# Patient Record
Sex: Male | Born: 1980 | Race: White | Hispanic: No | Marital: Married | Smoking: Never smoker
Health system: Southern US, Community
[De-identification: ages and names within clinical notes are randomized; demographics above are authoritative.]

## PROBLEM LIST (undated history)

## (undated) DIAGNOSIS — N289 Disorder of kidney and ureter, unspecified: Secondary | ICD-10-CM

## (undated) HISTORY — PX: APPENDECTOMY: SHX54

---

## 2009-11-30 ENCOUNTER — Emergency Department (HOSPITAL_COMMUNITY): Admission: EM | Admit: 2009-11-30 | Discharge: 2009-12-01 | Payer: Self-pay | Admitting: Psychology

## 2011-01-16 LAB — BASIC METABOLIC PANEL
CO2: 23 mEq/L (ref 19–32)
Calcium: 9 mg/dL (ref 8.4–10.5)
Creatinine, Ser: 1.12 mg/dL (ref 0.4–1.5)
GFR calc Af Amer: >60 mL/min (ref >60)
Glucose, Bld: 88 mg/dL (ref 70–99)

## 2011-01-16 LAB — DIFFERENTIAL
Basophils Absolute: 0 10*3/uL (ref 0.0–0.1)
Basophils Relative: 0 % (ref 0–1)
Neutro Abs: 7.6 10*3/uL (ref 1.7–7.7)
Neutrophils Relative %: 83 % — ABNORMAL HIGH (ref 43–77)

## 2011-01-16 LAB — CBC
MCHC: 33.8 g/dL (ref 30.0–36.0)
Platelets: 207 10*3/uL (ref 150–400)
RBC: 5.44 MIL/uL (ref 4.22–5.81)
RDW: 12.5 % (ref 11.5–15.5)

## 2017-02-12 ENCOUNTER — Emergency Department (HOSPITAL_BASED_OUTPATIENT_CLINIC_OR_DEPARTMENT_OTHER)
Admission: EM | Admit: 2017-02-12 | Discharge: 2017-02-13 | Disposition: A | Discharge status: Home / Self Care | Payer: Self-pay | Attending: Emergency Medicine | Admitting: Emergency Medicine

## 2017-02-12 ENCOUNTER — Emergency Department (HOSPITAL_BASED_OUTPATIENT_CLINIC_OR_DEPARTMENT_OTHER): Payer: Self-pay

## 2017-02-12 ENCOUNTER — Encounter (HOSPITAL_BASED_OUTPATIENT_CLINIC_OR_DEPARTMENT_OTHER): Payer: Self-pay | Admitting: *Deleted

## 2017-02-12 DIAGNOSIS — N200 Calculus of kidney: Secondary | ICD-10-CM | POA: Insufficient documentation

## 2017-02-12 LAB — URINALYSIS, MICROSCOPIC (REFLEX)

## 2017-02-12 LAB — BASIC METABOLIC PANEL
Anion gap: 10 (ref 5–15)
BUN: 25 mg/dL — AB (ref 6–20)
CALCIUM: 9.5 mg/dL (ref 8.9–10.3)
CO2: 27 mmol/L (ref 22–32)
CREATININE: 1.36 mg/dL — AB (ref 0.61–1.24)
Chloride: 101 mmol/L (ref 101–111)
GFR calc Af Amer: >60 mL/min (ref >60)
GFR calc non Af Amer: >60 mL/min (ref >60)
GLUCOSE: 139 mg/dL — AB (ref 65–99)
Potassium: 3.1 mmol/L — ABNORMAL LOW (ref 3.5–5.1)
Sodium: 138 mmol/L (ref 135–145)

## 2017-02-12 LAB — URINALYSIS, ROUTINE W REFLEX MICROSCOPIC
BILIRUBIN URINE: NEGATIVE
GLUCOSE, UA: NEGATIVE mg/dL
Ketones, ur: >80 mg/dL — AB
NITRITE: NEGATIVE
Protein, ur: NEGATIVE mg/dL
SPECIFIC GRAVITY, URINE: 1.021 (ref 1.005–1.030)
pH: 7 (ref 5.0–8.0)

## 2017-02-12 LAB — CBC
HEMATOCRIT: 47.4 % (ref 39.0–52.0)
Hemoglobin: 16 g/dL (ref 13.0–17.0)
MCH: 28.2 pg (ref 26.0–34.0)
MCHC: 33.8 g/dL (ref 30.0–36.0)
MCV: 83.6 fL (ref 78.0–100.0)
Platelets: 325 10*3/uL (ref 150–400)
RBC: 5.67 MIL/uL (ref 4.22–5.81)
RDW: 13.4 % (ref 11.5–15.5)
WBC: 11.1 10*3/uL — ABNORMAL HIGH (ref 4.0–10.5)

## 2017-02-12 MED ORDER — HYDROMORPHONE HCL 1 MG/ML IJ SOLN
1.0000 mg | Freq: Once | INTRAMUSCULAR | Status: AC
  Administered 2017-02-12: 1 mg via INTRAVENOUS
  Filled 2017-02-12: qty 1

## 2017-02-12 MED ORDER — FENTANYL CITRATE (PF) 100 MCG/2ML IJ SOLN
50.0000 ug | INTRAMUSCULAR | Status: DC | PRN
  Administered 2017-02-12: 50 ug via INTRAVENOUS
  Filled 2017-02-12: qty 2

## 2017-02-12 MED ORDER — ONDANSETRON HCL 4 MG/2ML IJ SOLN
4.0000 mg | Freq: Once | INTRAMUSCULAR | Status: AC
  Administered 2017-02-12: 4 mg via INTRAVENOUS
  Filled 2017-02-12: qty 2

## 2017-02-12 NOTE — ED Notes (Signed)
EMT went to ask Patient for a urine sample. Patient stated he was unable to pee.

## 2017-02-12 NOTE — ED Provider Notes (Signed)
[facility] Provider Note   CSN: [id number] Arrival date & time: 02/12/17  2108  By signing my name below, I, Nelwyn Salisbury, attest that this documentation has been prepared under the direction and in the presence of non-physician practitioner, Graciella Freer, PA-C. Electronically Signed: Nelwyn Salisbury, Scribe. 02/12/2017. 10:35 PM.  History   Chief Complaint Chief Complaint  Patient presents with  . Abdominal Pain   The history is provided by the patient. No language interpreter was used.    HPI Comments:  Paul Duran is an otherwise healthy 36 y.o. male who presents to the Emergency Department complaining of constant, moderate left-flank pain onset 7 hours. He describes his pain as a 10/10 sharp pain radiating to his groin which is exacerbated by movement and certain positions. Pt reports associated vomiting, nausea and hematuria. He has tried acetaminophen and was given Fentanyl here in the ED with minimal relief. Denies any diarrhea, bloody stool, dysuria, difficulty urinating, fever, testicular swelling, testicular pain or any other symptoms. Pt has a previous abdominal shx of cholecystectomy.   History reviewed. No pertinent past medical history.  There are no active problems to display for this patient.   Past Surgical History:  Procedure Laterality Date  . APPENDECTOMY         Home Medications    Prior to Admission medications   Medication Sig Start Date End Date Taking? Authorizing Provider  oxyCODONE-acetaminophen (PERCOCET/ROXICET) 5-325 MG tablet Take 2 tablets by mouth every 4 (four) hours as needed for severe pain. 02/13/17   Maxwell Caul, PA-C  tamsulosin (FLOMAX) 0.4 MG CAPS capsule Take 1 capsule (0.4 mg total) by mouth daily. 02/13/17   Maxwell Caul, PA-C    Family History No family history on file.  Social History Social History  Substance Use Topics  . Smoking status: Never Smoker  . Smokeless tobacco: Never Used  .  Alcohol use No     Allergies   Patient has no known allergies.   Review of Systems Review of Systems  Constitutional: Negative for chills and fever.  HENT: Negative for congestion, rhinorrhea and sore throat.   Eyes: Negative for visual disturbance.  Respiratory: Negative for cough and shortness of breath.   Cardiovascular: Negative for chest pain.  Gastrointestinal: Positive for abdominal pain, nausea and vomiting. Negative for blood in stool and diarrhea.  Genitourinary: Positive for hematuria. Negative for difficulty urinating, dysuria, penile swelling, scrotal swelling and testicular pain.  Musculoskeletal: Negative for back pain and neck pain.  Skin: Negative for rash.  Neurological: Negative for dizziness, weakness, numbness and headaches.  Psychiatric/Behavioral: Negative for confusion.  All other systems reviewed and are negative.    Physical Exam Updated Vital Signs BP (!) 146/90   Pulse 85   Temp 97.5 F (36.4 C) (Oral)   Resp 16   Ht  (1.803 m)   Wt 83 kg   SpO2 97%   BMI 25.52 kg/m   Physical Exam  Constitutional: He is oriented to person, place, and time. He appears well-developed and well-nourished.  Appears uncomfortable, constantly changing positions in bed  HENT:  Head: Normocephalic and atraumatic.  Mouth/Throat: Oropharynx is clear and moist and mucous membranes are normal.  Eyes: Conjunctivae, EOM and lids are normal. Pupils are equal, round, and reactive to light.  Neck: Full passive range of motion without pain.  Cardiovascular: Normal rate, regular rhythm, normal heart sounds and normal pulses.  Exam reveals no gallop and no friction rub.   No  murmur heard. Pulmonary/Chest: Effort normal and breath sounds normal.  Abdominal: Soft. Normal appearance. There is tenderness in the left lower quadrant. There is CVA tenderness (Left).  Genitourinary: Testes normal and penis normal. Right testis shows no swelling and no tenderness. Left testis  shows no swelling and no tenderness. No penile tenderness.  Genitourinary Comments: The exam was performed with a chaperone present.  Musculoskeletal: Normal range of motion.  Neurological: He is alert and oriented to person, place, and time.  Skin: Skin is warm and dry. Capillary refill takes less than 2 seconds.  Psychiatric: He has a normal mood and affect. His speech is normal.  Nursing note and vitals reviewed.    ED Treatments / Results  DIAGNOSTIC STUDIES:  Oxygen Saturation is 100% on RA, normal by my interpretation.    COORDINATION OF CARE:  10:35 PM Discussed treatment plan with pt at bedside which includes dilaudid and imaging and pt agreed to plan.  Labs (all labs ordered are listed, but only abnormal results are displayed) Labs Reviewed  URINALYSIS, ROUTINE W REFLEX MICROSCOPIC - Abnormal; Notable for the following:       Result Value   APPearance CLOUDY (*)    Hgb urine dipstick LARGE (*)    Ketones, ur >80 (*)    Leukocytes, UA TRACE (*)    All other components within normal limits  CBC - Abnormal; Notable for the following:    WBC 11.1 (*)    All other components within normal limits  BASIC METABOLIC PANEL - Abnormal; Notable for the following:    Potassium 3.1 (*)    Glucose, Bld 139 (*)    BUN 25 (*)    Creatinine, Ser 1.36 (*)    All other components within normal limits  URINALYSIS, MICROSCOPIC (REFLEX) - Abnormal; Notable for the following:    Bacteria, UA FEW (*)    Squamous Epithelial / LPF 0-5 (*)    All other components within normal limits    EKG  EKG Interpretation None       Radiology Ct Renal Stone Study  Result Date: 02/12/2017 CLINICAL DATA:  Left lower quadrant pain for 8 hours. Unable to urinate. Nausea and vomiting. Hematuria. EXAM: CT ABDOMEN AND PELVIS WITHOUT CONTRAST TECHNIQUE: Multidetector CT imaging of the abdomen and pelvis was performed following the standard protocol without IV contrast. COMPARISON:  None. FINDINGS:  Lower chest: Atelectasis in the lung bases. Calcified granuloma in the right lung base. Hepatobiliary: No focal liver abnormality is seen. No gallstones, gallbladder wall thickening, or biliary dilatation. Pancreas: Unremarkable. No pancreatic ductal dilatation or surrounding inflammatory changes. Spleen: Normal in size without focal abnormality. Adrenals/Urinary Tract: No adrenal gland nodules. 4 mm stone in the proximal left ureter at the level of L4. There is proximal hydronephrosis and hydroureter with prominent stranding and edema around the left kidney and ureter. The distal ureter is decompressed. Additional intrarenal stones are demonstrated in both kidneys. No evidence of ureteral stone or obstruction on the right. Bladder wall is not thickened and no bladder stones are identified. Stomach/Bowel: Stomach, small bowel, and colon are not abnormally distended. Scattered stool in the colon. Colonic diverticula mostly in the sigmoid region. No evidence of diverticulitis. Appendix is surgically absent. Vascular/Lymphatic: No significant vascular findings are present. No enlarged abdominal or pelvic lymph nodes. Reproductive: Prostate gland is enlarged, measuring 4.6 cm diameter. Prostate calcifications are present. Other: No abdominal wall hernia or abnormality. No abdominopelvic ascites. Musculoskeletal: Degenerative changes in the lower lumbar spine. No destructive  bone lesions. IMPRESSION: 4 mm stone in the proximal left ureter with moderate proximal obstruction. Additional nonobstructing intrarenal stones in both kidneys. Prostate gland is enlarged. Electronically Signed   By: Burman Nieves M.D.   On: 02/12/2017 23:27    Procedures Procedures (including critical care time)  Medications Ordered in ED Medications  ondansetron [facility]) injection 4 mg (4 mg Intravenous Given 02/12/17 2151)  HYDROmorphone (DILAUDID) injection 1 mg (1 mg Intravenous Given 02/12/17 2243)  HYDROmorphone (DILAUDID)  injection 1 mg (1 mg Intravenous Given 02/13/17 0012)  ketorolac (TORADOL) 30 MG/ML injection 30 mg (30 mg Intravenous Given 02/13/17 0100)     Initial Impression / Assessment and Plan / ED Course  I have reviewed the triage vital signs and the nursing notes.  Pertinent labs & imaging results that were available during my care of the patient were reviewed by me and considered in my medical decision making (see chart for details).     36 year old male presents emergency Department with left flank pain that radiates to the left lower quadrant and left groin that began today at 3:30 pm. Also reports hematuria but no dysuria. Took Tylenol with no relief. Physical exam with left-sided CVA tenderness left lower quadrant tenderness. GU exam normal. Concern for kidney stone, given history/exam versus acute infectious etiology. Antiemetics and analgesics provided in the department. Labs ordered in triage. Patient still needs to provide a urine sample. IVF given for fluid resuscitation. Will obtain CT renal stone to evaluate for kidney stone.   Labs and imaging reviewed. CT renal stone positive for 4mm kidney stone. Results discussed with patient. He is still in significant pain after  of Dilaudid. Will give additional Toradol for increased analgesic effect. Will attempt better pain control before discharging home.   Re-evaluation: Patient reports feeling improved after Toradol and feels comfortable going home. Labs and imaging reviewed with patient. Instructed patient to follow-up with referred urologist in 2 days. Return precautions discussed. Patient expresses understanding and agreement to plan.    Final Clinical Impressions(s) / ED Diagnoses   Final diagnoses:  Kidney stone    New Prescriptions Discharge Medication List as of 02/13/2017  1:35 AM    START taking these medications   Details  oxyCODONE-acetaminophen (PERCOCET/ROXICET) 5-325 MG tablet Take 2 tablets by mouth every 4 (four) hours  as needed for severe pain., Starting Fri 02/13/2017, Print    tamsulosin (FLOMAX) 0.4 MG CAPS capsule Take 1 capsule (0.4 mg total) by mouth daily., Starting Fri 02/13/2017, Print      I personally performed the services described in this documentation, which was scribed in my presence. The recorded information has been reviewed and is accurate.     Maxwell Caul, PA-C 02/13/17 2157    Vanetta Mulders, MD 02/14/17 1556

## 2017-02-12 NOTE — ED Triage Notes (Signed)
Left lower quadrant pain since 3pm. No radiation. Restless.

## 2017-02-12 NOTE — ED Notes (Signed)
Patient transported to CT 

## 2017-02-13 MED ORDER — KETOROLAC TROMETHAMINE 30 MG/ML IJ SOLN
INTRAMUSCULAR | Status: AC
  Filled 2017-02-13: qty 1

## 2017-02-13 MED ORDER — HYDROMORPHONE HCL 1 MG/ML IJ SOLN
1.0000 mg | Freq: Once | INTRAMUSCULAR | Status: AC
  Administered 2017-02-13: 1 mg via INTRAVENOUS
  Filled 2017-02-13: qty 1

## 2017-02-13 MED ORDER — OXYCODONE-ACETAMINOPHEN 5-325 MG PO TABS
2.0000 | ORAL_TABLET | ORAL | 0 refills | Status: DC | PRN
Start: 2017-02-13 — End: 2017-10-24

## 2017-02-13 MED ORDER — KETOROLAC TROMETHAMINE 30 MG/ML IJ SOLN
30.0000 mg | Freq: Once | INTRAMUSCULAR | Status: AC
  Administered 2017-02-13: 30 mg via INTRAVENOUS

## 2017-02-13 MED ORDER — TAMSULOSIN HCL 0.4 MG PO CAPS: 0.4000 mg | ORAL_CAPSULE | Freq: Every day | ORAL | 0 refills | Status: AC

## 2017-02-13 NOTE — Discharge Instructions (Signed)
Your CT scan showed a kidney stone. Make sure you're drinking lots of fluids and staying hydrated.  Take pain medication as directed for pain relief.  Take Flomax as directed.  Called the urologist that is provided above in 24-48 hours to arrange for further appointment.  Return to the emergency department for any worsening pain, fever, persistent vomiting, or any other worsening or concerning symptoms.  If you do not have a primary care doctor you see regularly, please you the list below. Please call them to arrange for follow-up.    No Primary Care Doctor Call [facility]  [phone] Other agencies that provide inexpensive medical care    [facility]  [phone]    [facility]  [phone]    [facility]  [phone]    [facility]  [phone]    [facility]  [phone]    [facility]  [phone]

## 2017-10-24 ENCOUNTER — Other Ambulatory Visit: Payer: Self-pay

## 2017-10-24 ENCOUNTER — Emergency Department (HOSPITAL_BASED_OUTPATIENT_CLINIC_OR_DEPARTMENT_OTHER)
Admission: EM | Admit: 2017-10-24 | Discharge: 2017-10-24 | Disposition: A | Discharge status: Home / Self Care | Payer: Self-pay | Attending: Emergency Medicine | Admitting: Emergency Medicine

## 2017-10-24 ENCOUNTER — Emergency Department (HOSPITAL_BASED_OUTPATIENT_CLINIC_OR_DEPARTMENT_OTHER): Payer: Self-pay

## 2017-10-24 ENCOUNTER — Encounter (HOSPITAL_BASED_OUTPATIENT_CLINIC_OR_DEPARTMENT_OTHER): Payer: Self-pay | Admitting: *Deleted

## 2017-10-24 DIAGNOSIS — N453 Epididymo-orchitis: Secondary | ICD-10-CM | POA: Insufficient documentation

## 2017-10-24 HISTORY — DX: Disorder of kidney and ureter, unspecified: N28.9

## 2017-10-24 MED ORDER — OXYCODONE-ACETAMINOPHEN 5-325 MG PO TABS
1.0000 | ORAL_TABLET | Freq: Once | ORAL | Status: AC
  Administered 2017-10-24: 1 via ORAL
  Filled 2017-10-24: qty 1

## 2017-10-24 MED ORDER — OXYCODONE-ACETAMINOPHEN 5-325 MG PO TABS: 1.0000 | ORAL_TABLET | ORAL | 0 refills | Status: AC | PRN

## 2017-10-24 MED ORDER — CEFTRIAXONE SODIUM 250 MG IJ SOLR
250.0000 mg | Freq: Once | INTRAMUSCULAR | Status: AC
  Administered 2017-10-24: 250 mg via INTRAMUSCULAR
  Filled 2017-10-24: qty 250

## 2017-10-24 MED ORDER — DOXYCYCLINE HYCLATE 100 MG PO TABS
100.0000 mg | ORAL_TABLET | Freq: Once | ORAL | Status: AC
  Administered 2017-10-24: 100 mg via ORAL
  Filled 2017-10-24: qty 1

## 2017-10-24 MED ORDER — DOXYCYCLINE HYCLATE 100 MG PO CAPS: 100.0000 mg | ORAL_CAPSULE | Freq: Two times a day (BID) | ORAL | 0 refills | Status: AC

## 2017-10-24 NOTE — ED Triage Notes (Signed)
Patient states he developed left testicular pain around 10/15/2017.  States the veins in the right testicle began to swell in the beginning, and after several days the testicle began to swell and have pain.  States the pain and swelling have been constant and over the last three days the pain has gotten worse.

## 2017-10-24 NOTE — ED Provider Notes (Signed)
Emergency Department Provider Note   I have reviewed the triage vital signs and the nursing notes.   HISTORY  Chief Complaint Testicle Pain   HPI Paul Duran is a 36 y.o. male for evaluation of severe left testicle pain over the last 3 days.  Patient states he began having some discomfort and swelling in the area 9 days ago but the severe pain started only 3 days.  Radiates slightly to the left inguinal area.  The patient has had kidney stones in the past and states that this feels very different from that.  He denies any dysuria.  He did notice some blood in the semen last week but that has resolved.  No fevers or chills.  Denies trauma to the area.  Over-the-counter medications and watchful waiting with no relief in symptoms. Pain worse with movement and relieved by laying in certain positions.   Past Medical History:  Diagnosis Date  . Renal disorder    Kidney stone    There are no active problems to display for this patient.   Past Surgical History:  Procedure Laterality Date  . APPENDECTOMY      Current Outpatient Rx  . Order #: [id number] Class: Print  . Order #: [id number] Class: Print  . Order #: [id number] Class: Print    Allergies Patient has no known allergies.  No family history on file.  Social History Social History   Tobacco Use  . Smoking status: Never Smoker  . Smokeless tobacco: Never Used  Substance Use Topics  . Alcohol use: Yes    Comment: occassionally  . Drug use: Yes    Types: Marijuana    Review of Systems  Constitutional: No fever/chills Eyes: No visual changes. ENT: No sore throat. Cardiovascular: Denies chest pain. Respiratory: Denies shortness of breath. Gastrointestinal: No abdominal pain.  No nausea, no vomiting.  No diarrhea.  No constipation. Genitourinary: Negative for dysuria. Positive left testicle pain and swelling.  Musculoskeletal: Negative for back pain. Skin: Negative for rash. Neurological: Negative for  headaches, focal weakness or numbness.  10-point ROS otherwise negative.  ____________________________________________   PHYSICAL EXAM:  VITAL SIGNS: ED Triage Vitals  Enc Vitals Group     BP 10/24/17 1000 134/80     Pulse Rate 10/24/17 1000 (!) 110     Resp 10/24/17 1000 (!) 22     Temp 10/24/17 1000 98.4 F (36.9 C)     Temp Source 10/24/17 1000 Oral     SpO2 10/24/17 1000 100 %     Weight 10/24/17 1000 200 lb (90.7 kg)     Height 10/24/17 1000 5\' 10"  (1.778 m)     Pain Score 10/24/17 1007 10    Constitutional: Alert and oriented. Appears uncomfortable but provides a full history.  Eyes: Conjunctivae are normal. Head: Atraumatic. Nose: No congestion/rhinnorhea. Mouth/Throat: Mucous membranes are moist.  Oropharynx non-erythematous. Neck: No stridor.   Cardiovascular: Normal rate, regular rhythm. Good peripheral circulation. Grossly normal heart sounds.   Respiratory: Normal respiratory effort.  No retractions. Lungs CTAB. Gastrointestinal: Soft and nontender. No distention.  Genitourinary: Mild swelling of the left testicle with some posterior tenderness. No appreciable inguinal hernia. No scrotal cellulitis.  Musculoskeletal: No lower extremity tenderness nor edema. No gross deformities of extremities. Neurologic:  Normal speech and language. No gross focal neurologic deficits are appreciated.  Skin:  Skin is warm, dry and intact. No rash noted. ____________________________________________  RADIOLOGY  [place] Scrotum  Result Date: 10/24/2017 CLINICAL DATA:  Severe left testicular pain  for 3 days. EXAM: SCROTAL ULTRASOUND DOPPLER ULTRASOUND OF THE TESTICLES TECHNIQUE: Complete ultrasound examination of the testicles, epididymis, and other scrotal structures was performed. Color and spectral Doppler ultrasound were also utilized to evaluate blood flow to the testicles. COMPARISON:  None. FINDINGS: Right testicle Measurements: 4.2 x 2.2 x 2.3 cm. No mass or microlithiasis  visualized. Left testicle Measurements: 4.6 x 2.7 x 3.3 cm. The left testicle is hypervascular compared to the right testicle. No mass or microlithiasis visualized. Right epididymis:  Normal in size and appearance. Left epididymis:  Enlarged and hypervascular. Hydrocele:  Left hydrocele. Varicocele:  None visualized. Pulsed Doppler interrogation of both testes demonstrates normal low resistance arterial and venous waveforms bilaterally. IMPRESSION: Left orchitis/ epididymitis.  No evidence of testicular torsion. Electronically Signed   By: Sherian Rein M.D.   On: 10/24/2017 11:12   [place] Art/ven Flow Abd Pelv Doppler  Result Date: 10/24/2017 CLINICAL DATA:  Severe left testicular pain for 3 days. EXAM: SCROTAL ULTRASOUND DOPPLER ULTRASOUND OF THE TESTICLES TECHNIQUE: Complete ultrasound examination of the testicles, epididymis, and other scrotal structures was performed. Color and spectral Doppler ultrasound were also utilized to evaluate blood flow to the testicles. COMPARISON:  None. FINDINGS: Right testicle Measurements: 4.2 x 2.2 x 2.3 cm. No mass or microlithiasis visualized. Left testicle Measurements: 4.6 x 2.7 x 3.3 cm. The left testicle is hypervascular compared to the right testicle. No mass or microlithiasis visualized. Right epididymis:  Normal in size and appearance. Left epididymis:  Enlarged and hypervascular. Hydrocele:  Left hydrocele. Varicocele:  None visualized. Pulsed Doppler interrogation of both testes demonstrates normal low resistance arterial and venous waveforms bilaterally. IMPRESSION: Left orchitis/ epididymitis.  No evidence of testicular torsion. Electronically Signed   By: Sherian Rein M.D.   On: 10/24/2017 11:12    ____________________________________________   PROCEDURES  Procedure(s) performed:   Procedures  None ____________________________________________   INITIAL IMPRESSION / ASSESSMENT AND PLAN / ED COURSE  Pertinent labs & imaging results that were  available during my care of the patient were reviewed by me and considered in my medical decision making (see chart for details).  Patient presents to the emergency department for evaluation of left testicle pain worsening significantly over the past 3 days but present for the last 9 days.  He has tenderness over the epididymis increasing my suspicion for epididymitis versus hydrocele.  No appreciable inguinal hernia.  Lower suspicion for testicular torsion but plan for ultrasound to evaluate for this diagnosis.   [place] with orchitis. No evidence of torsion. Plan for treatment with Ceftriaxone x 1 and Doxycycline x 10 days. Provided Urology contact for follow up. Treating pain with Percocet.   At this time, I do not feel there is any life-threatening condition present. I have reviewed and discussed all results (EKG, imaging, lab, urine as appropriate), exam findings with patient. I have reviewed nursing notes and appropriate previous records.  I feel the patient is safe to be discharged home without further emergent workup. Discussed usual and customary return precautions. Patient and family (if present) verbalize understanding and are comfortable with this plan.  Patient will follow-up with their primary care provider. If they do not have a primary care provider, information for follow-up has been provided to them. All questions have been answered.  ____________________________________________  FINAL CLINICAL IMPRESSION(S) / ED DIAGNOSES  Final diagnoses:  Orchitis and epididymitis     MEDICATIONS GIVEN DURING THIS VISIT:  Medications  oxyCODONE-acetaminophen (PERCOCET/ROXICET) 5-325 MG per tablet 1 tablet (1 tablet  Oral Given 10/24/17 1025)  cefTRIAXone (ROCEPHIN) injection 250 mg (250 mg Intramuscular Given 10/24/17 1153)  doxycycline (VIBRA-TABS) tablet 100 mg (100 mg Oral Given 10/24/17 1153)     NEW OUTPATIENT MEDICATIONS STARTED DURING THIS VISIT:  Doxy and Percocet   Note:  This  document was prepared using Dragon voice recognition software and may include unintentional dictation errors.  Paul BeneJoshua Marzell Isakson, MD Emergency Medicine    Paul Duran, Arlyss RepressJoshua G, MD 10/24/17 [phone]

## 2017-10-24 NOTE — Discharge Instructions (Signed)
You were seen in the ED today with testicle pain. You have inflammation of the testicle which is likely caused by a bacterial infection. Take the antibiotics as prescribed. Follow up with your PCP and Urology if symptoms do not improve.

## 2019-01-07 IMAGING — CT CT RENAL STONE PROTOCOL
2 of 4 series · 16 of 46 positions shown, 18 images · non-contrast
Comparison: None.

CLINICAL DATA: Left lower quadrant pain for 8 hours. Unable to
urinate. Nausea and vomiting. Hematuria.

EXAM:
CT ABDOMEN AND PELVIS WITHOUT CONTRAST
TECHNIQUE: Multidetector CT imaging of the abdomen and pelvis was performed
following the standard protocol without IV contrast.

[Series 2: axial st · axial · 0.86mm/px · z∈[-515,-45]mm · 13 of 104 slices shown, 15 images]
[im 5/104  soft-tissue]
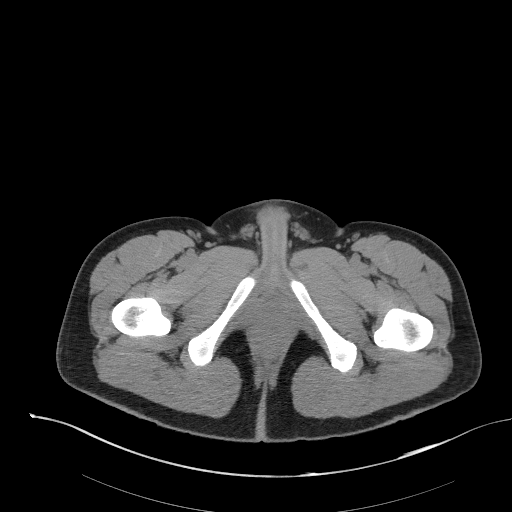
[im 5/104  bone]
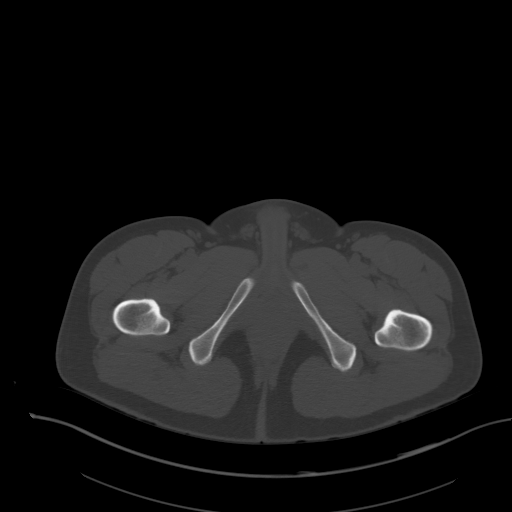
[im 13/104  soft-tissue]
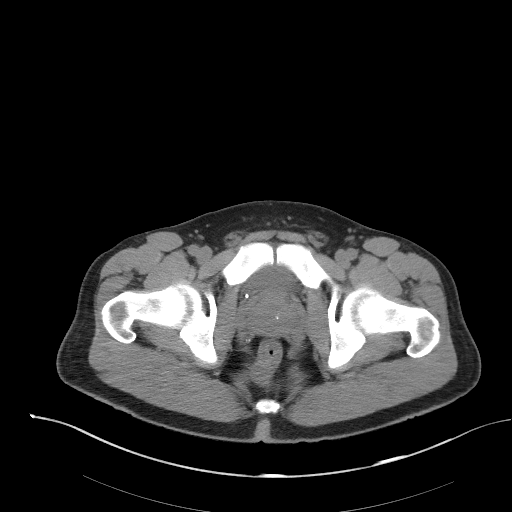
[im 22/104  soft-tissue]
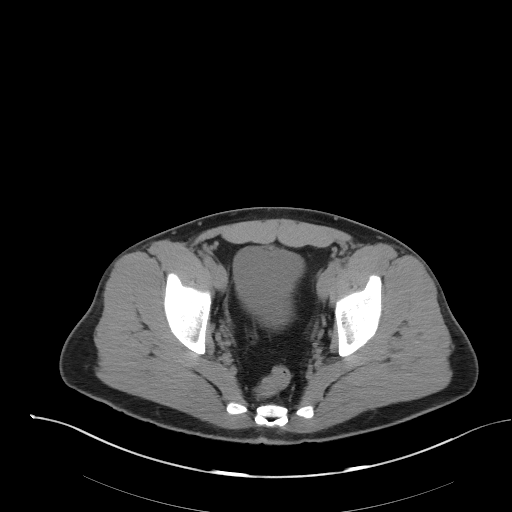
[im 31/104  soft-tissue]
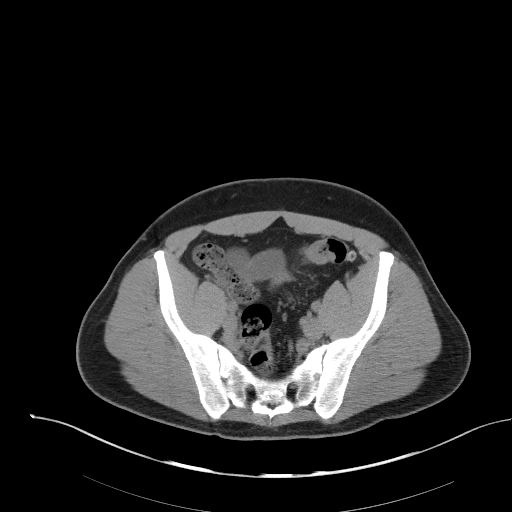
[im 35/104  soft-tissue]
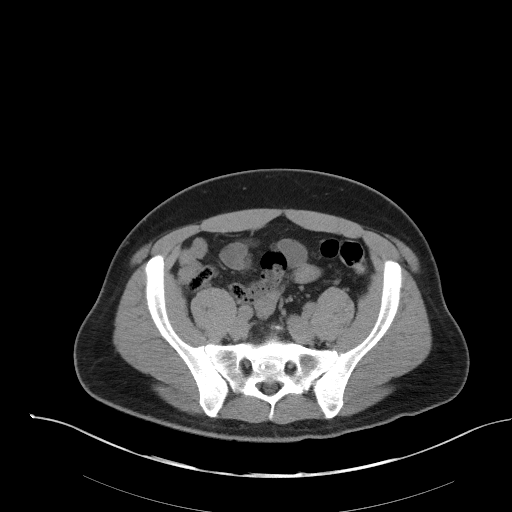
[im 43/104  soft-tissue]
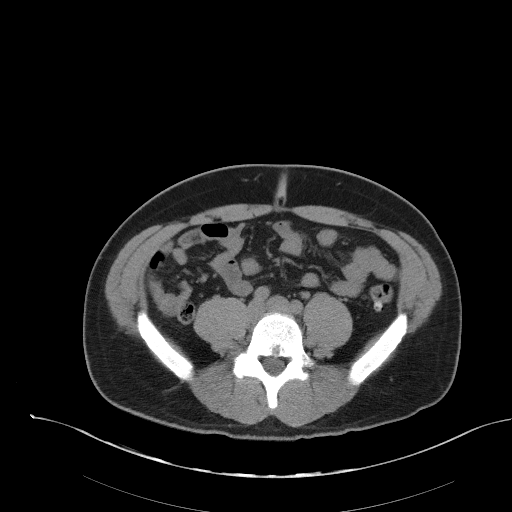
[im 52/104  soft-tissue]
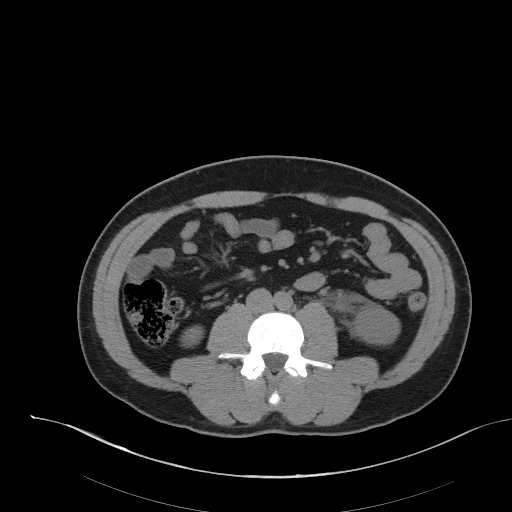
[im 61/104  soft-tissue]
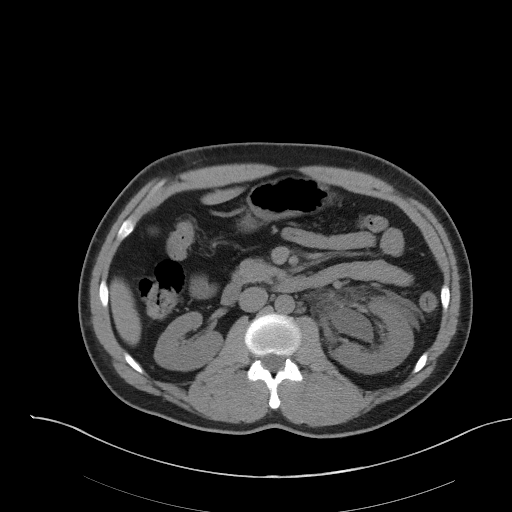
[im 69/104  soft-tissue]
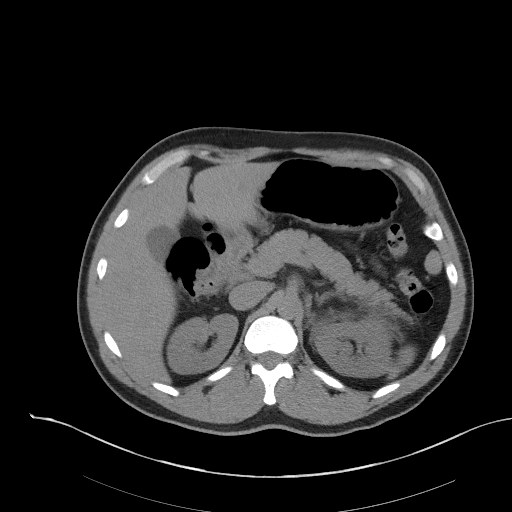
[im 69/104  bone]
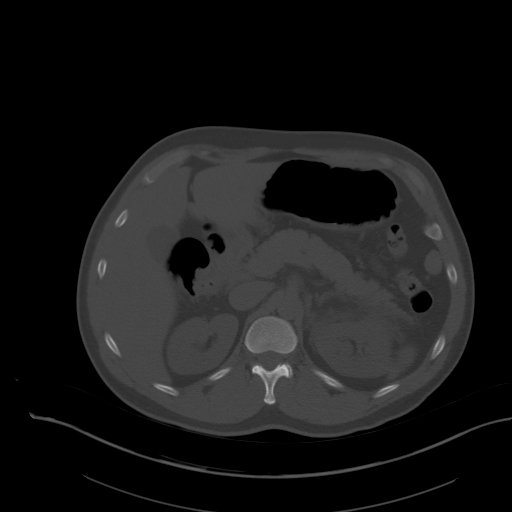
[im 73/104  soft-tissue]
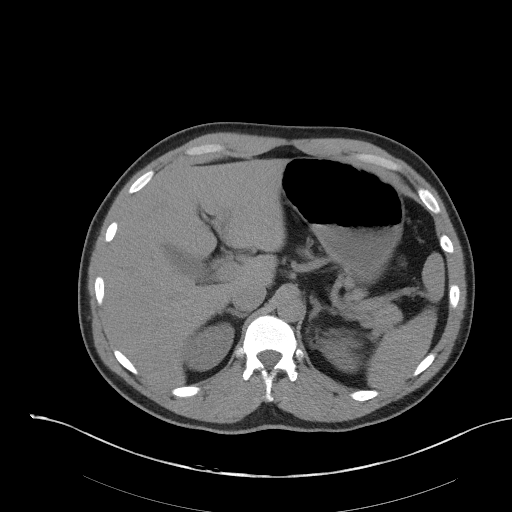
[im 82/104  soft-tissue]
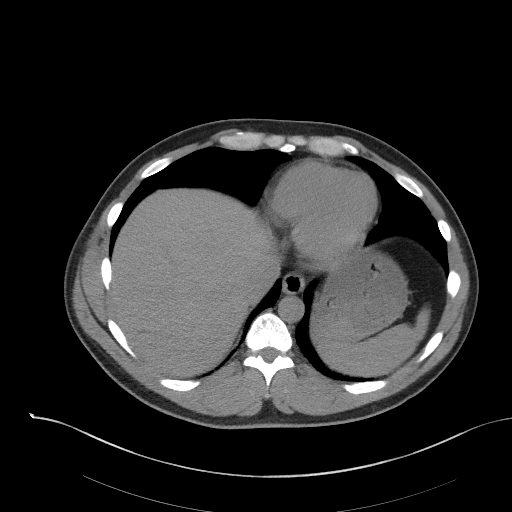
[im 91/104  soft-tissue]
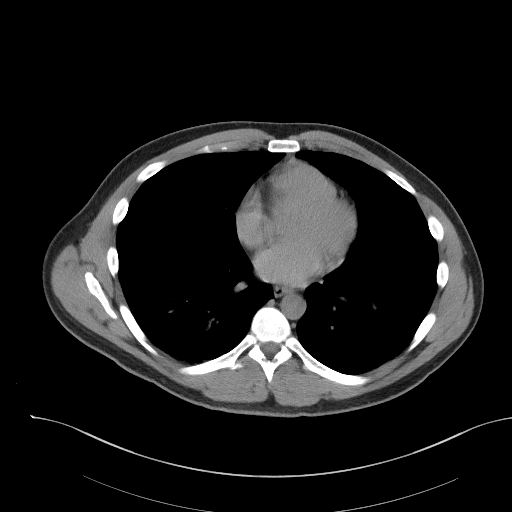
[im 99/104  soft-tissue]
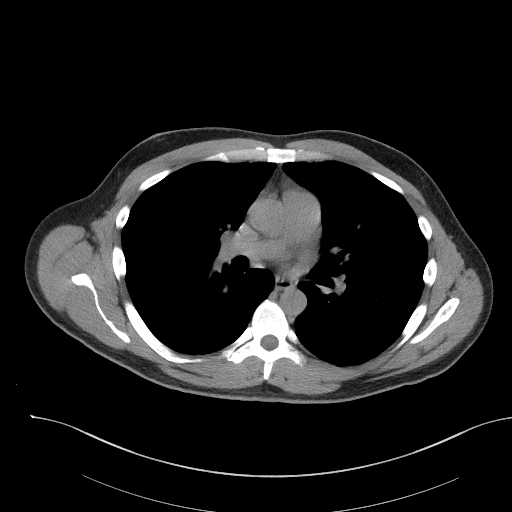

[Series 4: coronal st · coronal · 0.92mm/px · 3 of 80 slices shown]
[im 27/80  soft-tissue]
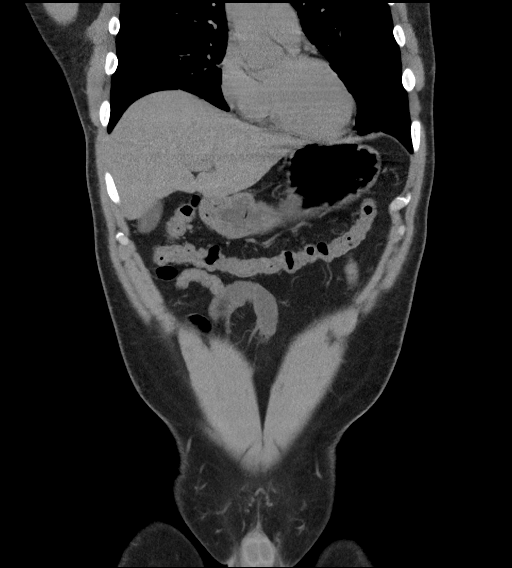
[im 36/80  soft-tissue]
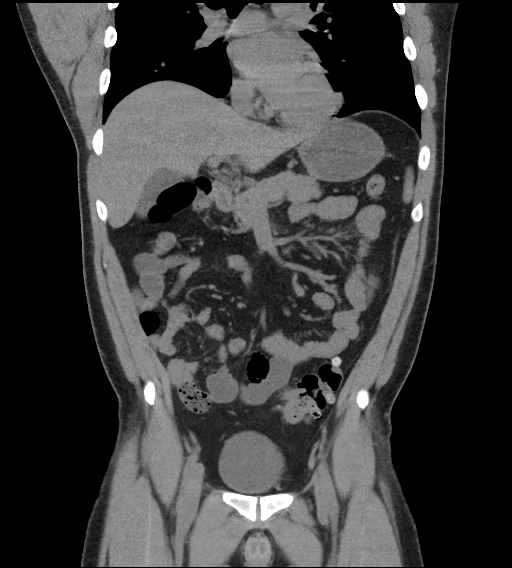
[im 44/80  soft-tissue]
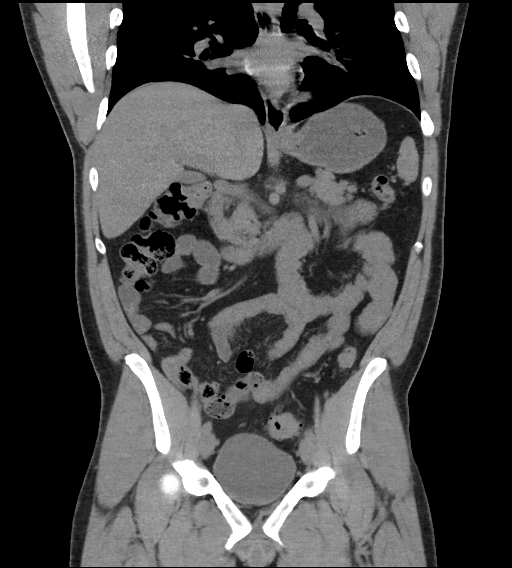

[16 of 46 positions shown; findings below may reference images not displayed]

FINDINGS: Lower chest: Atelectasis in the lung bases. Calcified granuloma in
the right lung base.

Hepatobiliary: No focal liver abnormality is seen. No gallstones,
gallbladder wall thickening, or biliary dilatation.

Pancreas: Unremarkable. No pancreatic ductal dilatation or
surrounding inflammatory changes.

Spleen: Normal in size without focal abnormality.

Adrenals/Urinary Tract: No adrenal gland nodules. 4 mm stone in the
proximal left ureter at the level of L4. There is proximal
hydronephrosis and hydroureter with prominent stranding and edema
around the left kidney and ureter. The distal ureter is
decompressed. Additional intrarenal stones are demonstrated in both
kidneys. No evidence of ureteral stone or obstruction on the right.
Bladder wall is not thickened and no bladder stones are identified.

Stomach/Bowel: Stomach, small bowel, and colon are not abnormally
distended. Scattered stool in the colon. Colonic diverticula mostly
in the sigmoid region. No evidence of diverticulitis. Appendix is
surgically absent.

Vascular/Lymphatic: No significant vascular findings are present. No
enlarged abdominal or pelvic lymph nodes.

Reproductive: Prostate gland is enlarged, measuring 4.6 cm diameter.
Prostate calcifications are present.

Other: No abdominal wall hernia or abnormality. No abdominopelvic
ascites.

Musculoskeletal: Degenerative changes in the lower lumbar spine. No
destructive bone lesions.
IMPRESSION: 4 mm stone in the proximal left ureter with moderate proximal
obstruction. Additional nonobstructing intrarenal stones in both
kidneys. Prostate gland is enlarged.
